# Patient Record
Sex: Male | Born: 2000
Health system: Southern US, Community
[De-identification: ages and names within clinical notes are randomized; demographics above are authoritative.]

## PROBLEM LIST (undated history)

## (undated) DIAGNOSIS — S62639A Displaced fracture of distal phalanx of unspecified finger, initial encounter for closed fracture: Secondary | ICD-10-CM

## (undated) DIAGNOSIS — J45909 Unspecified asthma, uncomplicated: Secondary | ICD-10-CM

## (undated) DIAGNOSIS — Z9109 Other allergy status, other than to drugs and biological substances: Secondary | ICD-10-CM

## (undated) DIAGNOSIS — D582 Other hemoglobinopathies: Secondary | ICD-10-CM

## (undated) HISTORY — PX: NASAL POLYP EXCISION: SHX2068

## (undated) HISTORY — PX: ADENOIDECTOMY: SUR15

---
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
## 2020-03-10 ENCOUNTER — Telehealth: Payer: Self-pay

## 2020-03-10 NOTE — Telephone Encounter (Signed)
Patient's new allergist is going to be mixing his vials based on our recipe/prescription. They need it faxed to Amy at [facility].  Phone-[phone] Fax-[phone]   Marylu Lund should be sending you the other information needed. Patient has already transferred care to their office. Thank you so much for your assistance!

## 2020-03-11 NOTE — Telephone Encounter (Signed)
Amy called the office today and asked about patient's vial prescription. I informed Amy that our medical records coordinator is out today and will fax it when she returns.
